# Patient Record
Sex: Male | Born: 1980 | Race: Black or African American | Hispanic: No | Marital: Married | Smoking: Never smoker
Health system: Southern US, Community
[De-identification: ages and names within clinical notes are randomized; demographics above are authoritative.]

---

## 2014-10-22 ENCOUNTER — Ambulatory Visit (HOSPITAL_BASED_OUTPATIENT_CLINIC_OR_DEPARTMENT_OTHER)
Admission: RE | Admit: 2014-10-22 | Discharge: 2014-10-22 | Disposition: A | Discharge status: Home / Self Care | Payer: 59 | Source: Ambulatory Visit | Attending: Family Medicine | Admitting: Family Medicine

## 2014-10-22 ENCOUNTER — Other Ambulatory Visit (HOSPITAL_BASED_OUTPATIENT_CLINIC_OR_DEPARTMENT_OTHER): Payer: Self-pay | Admitting: Family Medicine

## 2014-10-22 DIAGNOSIS — T1490XA Injury, unspecified, initial encounter: Secondary | ICD-10-CM

## 2014-10-22 DIAGNOSIS — M545 Low back pain: Secondary | ICD-10-CM | POA: Insufficient documentation
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
## 2017-06-24 DIAGNOSIS — J069 Acute upper respiratory infection, unspecified: Secondary | ICD-10-CM | POA: Diagnosis not present

## 2017-06-24 DIAGNOSIS — J302 Other seasonal allergic rhinitis: Secondary | ICD-10-CM | POA: Diagnosis not present

## 2017-11-13 ENCOUNTER — Encounter (HOSPITAL_COMMUNITY): Payer: Self-pay

## 2017-11-13 ENCOUNTER — Emergency Department (HOSPITAL_COMMUNITY): Payer: BLUE CROSS/BLUE SHIELD

## 2017-11-13 ENCOUNTER — Emergency Department (HOSPITAL_COMMUNITY)
Admission: EM | Admit: 2017-11-13 | Discharge: 2017-11-14 | Disposition: A | Discharge status: Home / Self Care | Payer: BLUE CROSS/BLUE SHIELD | Attending: Emergency Medicine | Admitting: Emergency Medicine

## 2017-11-13 ENCOUNTER — Other Ambulatory Visit: Payer: Self-pay

## 2017-11-13 DIAGNOSIS — S99911A Unspecified injury of right ankle, initial encounter: Secondary | ICD-10-CM | POA: Diagnosis present

## 2017-11-13 DIAGNOSIS — Y929 Unspecified place or not applicable: Secondary | ICD-10-CM | POA: Diagnosis not present

## 2017-11-13 DIAGNOSIS — Y999 Unspecified external cause status: Secondary | ICD-10-CM | POA: Diagnosis not present

## 2017-11-13 DIAGNOSIS — Y9367 Activity, basketball: Secondary | ICD-10-CM | POA: Insufficient documentation

## 2017-11-13 DIAGNOSIS — X500XXA Overexertion from strenuous movement or load, initial encounter: Secondary | ICD-10-CM | POA: Insufficient documentation

## 2017-11-13 DIAGNOSIS — S93401A Sprain of unspecified ligament of right ankle, initial encounter: Secondary | ICD-10-CM | POA: Diagnosis not present

## 2017-11-13 MED ORDER — IBUPROFEN 600 MG PO TABS: 600.0000 mg | ORAL_TABLET | Freq: Four times a day (QID) | ORAL | 0 refills | Status: DC | PRN

## 2017-11-13 NOTE — Discharge Instructions (Signed)
Wear a walking boot or ankle brace for stability.  We recommend crutches over the next few days to prevent from putting weight on your right ankle.  Take ibuprofen as prescribed for inflammation.  Ice your ankle 3-4 times per day for 15-20 minutes each time to limit swelling.  Follow-up with an orthopedist to ensure proper healing of your injury.  You may return to the emergency department, as needed, for new or concerning symptoms.

## 2017-11-13 NOTE — ED Provider Notes (Signed)
[facility]-EMERGENCY DEPT Provider Note   CSN: [id number] Arrival date & time: 11/13/17  2116     History   Chief Complaint Chief Complaint  Patient presents with  . Ankle Pain    R    HPI Zachary Moronan Villarreal is a 37 y.o. male.  37 year old male presents to the emergency department for evaluation of right ankle pain which began tonight while playing basketball.  He reports hearing a "snap" followed by onset of pain which has since been constant.  Pain is aggravated with ambulation, the patient has been weightbearing since the incident.  He has not taken any medications prior to arrival for symptoms.  No associated numbness or paresthesias.  He does have history of a high-grade right ankle sprain.  He has not been followed by orthopedics in many years.      History reviewed. No pertinent past medical history.  There are no active problems to display for this patient.   ** The histories are not reviewed yet. Please review them in the "History" navigator section and refresh this SmartLink.     Home Medications    Prior to Admission medications   Medication Sig Start Date End Date Taking? Authorizing Provider  ibuprofen (ADVIL,MOTRIN) 600 MG tablet Take 1 tablet (600 mg total) by mouth every 6 (six) hours as needed. 11/13/17   Antony MaduraHumes, Reyan Helle, PA-C    Family History History reviewed. No pertinent family history.  Social History Social History   Tobacco Use  . Smoking status: Not on file  Substance Use Topics  . Alcohol use: Not on file  . Drug use: Not on file     Allergies   Patient has no known allergies.   Review of Systems Review of Systems Ten systems reviewed and are negative for acute change, except as noted in the HPI.    Physical Exam Updated Vital Signs BP 133/85 (BP Location: Left Arm)   Pulse 66   Temp 98.5 F (36.9 C) (Oral)   Resp 19   SpO2 95%   Physical Exam  Constitutional: He is oriented to person, place, and time. He  appears well-developed and well-nourished. No distress.  Nontoxic appearing and in NAD  HENT:  Head: Normocephalic and atraumatic.  Eyes: Conjunctivae and EOM are normal. No scleral icterus.  Neck: Normal range of motion.  Cardiovascular: Normal rate, regular rhythm and intact distal pulses.  DP pulse 2+ in the RLE  Pulmonary/Chest: Effort normal. No stridor. No respiratory distress. He has no wheezes.  Respirations even and unlabored  Musculoskeletal: Normal range of motion. He exhibits tenderness.       Right ankle: He exhibits swelling (mild). He exhibits no deformity, no laceration and normal pulse. Tenderness. Lateral malleolus and medial malleolus tenderness found. Achilles tendon normal.       Feet:  Tenderness to palpation across the anterior ankle with normal Thompson test.  Focal tenderness noted to both the lateral and medial malleolus of the right ankle without bony deformity or crepitus.  Mild swelling to the ankle without effusion.  Patient able to wiggle all toes.  Neurological: He is alert and oriented to person, place, and time. He exhibits normal muscle tone. Coordination normal.  Sensation to light touch intact in BLE  Skin: Skin is warm and dry. No rash noted. He is not diaphoretic. No erythema. No pallor.  Psychiatric: He has a normal mood and affect. His behavior is normal.  Nursing note and vitals reviewed.    ED Treatments /  Results  Labs (all labs ordered are listed, but only abnormal results are displayed) Labs Reviewed - No data to display  EKG  EKG Interpretation None       Radiology Dg Ankle Complete Right  Result Date: 11/13/2017 CLINICAL DATA:  37 y/o  M; it rolling injury with pain. EXAM: RIGHT ANKLE - COMPLETE 3+ VIEW COMPARISON:  None. FINDINGS: Bony body adjacent to the medial malleolus may represent a mildly displaced fracture, correlate for focal tenderness. Talar dome is intact. Ankle mortise is symmetric on these nonstress views.  Osteophytes at the talar neck compatible with anterior ankle impingement in the appropriate clinical setting. Calcific tendinitis of the Achilles tendon. IMPRESSION: 1. Bony body adjacent to medial malleolus may represent mildly displaced fracture. Correlate for focal tenderness. 2. No ankle joint dislocation. 3. Osteophytosis of talar neck may represent anterior ankle impingement in the appropriate clinical setting. 4. Calcific tendinitis of Achilles tendon. Electronically Signed   By: Mitzi Hansen M.D.   On: 11/13/2017 22:56    Procedures Procedures (including critical care time)  Medications Ordered in ED Medications - No data to display   Initial Impression / Assessment and Plan / ED Course  I have reviewed the triage vital signs and the nursing notes.  Pertinent labs & imaging results that were available during my care of the patient were reviewed by me and considered in my medical decision making (see chart for details).     Patient presents to the emergency department for evaluation of R ankle pain, onset while playing basketball tonight. Patient neurovascularly intact on exam. Imaging notable for bony body adjacent to the medial malleolus.  This is less likely thought to represent acute fracture.  Patient does report prior high-grade ankle sprain to the right ankle.  He has been placed in a CAM walker and given an ASO ankle brace to use PRN.  Crutches provided for WBAT. Plan for supportive management including RICE and NSAIDs; Orthopedic f/u recommended. Return precautions discussed and provided. Patient discharged in stable condition with no unaddressed concerns.   Final Clinical Impressions(s) / ED Diagnoses   Final diagnoses:  Sprain of right ankle, unspecified ligament, initial encounter    ED Discharge Orders        Ordered    ibuprofen (ADVIL,MOTRIN) 600 MG tablet  Every 6 hours PRN     11/13/17 2333       Antony Madura, PA-C 11/13/17 2347    Glynn Octave, MD 11/14/17 [phone]

## 2017-11-13 NOTE — ED Triage Notes (Addendum)
Pt rolled his ankle inward while playing basketball tonight. He reports that he heard a "snap." No obvious deformity noted. Pt is ambulatory in triage. A&Ox4.

## 2017-11-21 DIAGNOSIS — M25571 Pain in right ankle and joints of right foot: Secondary | ICD-10-CM | POA: Diagnosis not present

## 2018-07-03 DIAGNOSIS — J069 Acute upper respiratory infection, unspecified: Secondary | ICD-10-CM | POA: Diagnosis not present

## 2018-07-03 DIAGNOSIS — G44209 Tension-type headache, unspecified, not intractable: Secondary | ICD-10-CM | POA: Diagnosis not present

## 2018-07-03 DIAGNOSIS — Z136 Encounter for screening for cardiovascular disorders: Secondary | ICD-10-CM | POA: Diagnosis not present

## 2018-07-07 DIAGNOSIS — M791 Myalgia, unspecified site: Secondary | ICD-10-CM | POA: Diagnosis not present

## 2018-07-27 DIAGNOSIS — M9902 Segmental and somatic dysfunction of thoracic region: Secondary | ICD-10-CM | POA: Diagnosis not present

## 2018-07-27 DIAGNOSIS — M9903 Segmental and somatic dysfunction of lumbar region: Secondary | ICD-10-CM | POA: Diagnosis not present

## 2018-07-27 DIAGNOSIS — M5417 Radiculopathy, lumbosacral region: Secondary | ICD-10-CM | POA: Diagnosis not present

## 2018-07-27 DIAGNOSIS — M9905 Segmental and somatic dysfunction of pelvic region: Secondary | ICD-10-CM | POA: Diagnosis not present

## 2018-07-28 DIAGNOSIS — M9905 Segmental and somatic dysfunction of pelvic region: Secondary | ICD-10-CM | POA: Diagnosis not present

## 2018-07-28 DIAGNOSIS — M9902 Segmental and somatic dysfunction of thoracic region: Secondary | ICD-10-CM | POA: Diagnosis not present

## 2018-07-28 DIAGNOSIS — M5417 Radiculopathy, lumbosacral region: Secondary | ICD-10-CM | POA: Diagnosis not present

## 2018-07-28 DIAGNOSIS — M9903 Segmental and somatic dysfunction of lumbar region: Secondary | ICD-10-CM | POA: Diagnosis not present

## 2018-08-07 DIAGNOSIS — M9905 Segmental and somatic dysfunction of pelvic region: Secondary | ICD-10-CM | POA: Diagnosis not present

## 2018-08-07 DIAGNOSIS — M9903 Segmental and somatic dysfunction of lumbar region: Secondary | ICD-10-CM | POA: Diagnosis not present

## 2018-08-07 DIAGNOSIS — M9902 Segmental and somatic dysfunction of thoracic region: Secondary | ICD-10-CM | POA: Diagnosis not present

## 2018-08-07 DIAGNOSIS — M5417 Radiculopathy, lumbosacral region: Secondary | ICD-10-CM | POA: Diagnosis not present

## 2019-03-21 DIAGNOSIS — M9902 Segmental and somatic dysfunction of thoracic region: Secondary | ICD-10-CM | POA: Diagnosis not present

## 2019-03-21 DIAGNOSIS — M5417 Radiculopathy, lumbosacral region: Secondary | ICD-10-CM | POA: Diagnosis not present

## 2019-03-21 DIAGNOSIS — M9905 Segmental and somatic dysfunction of pelvic region: Secondary | ICD-10-CM | POA: Diagnosis not present

## 2019-03-21 DIAGNOSIS — M9903 Segmental and somatic dysfunction of lumbar region: Secondary | ICD-10-CM | POA: Diagnosis not present

## 2019-03-23 DIAGNOSIS — M9902 Segmental and somatic dysfunction of thoracic region: Secondary | ICD-10-CM | POA: Diagnosis not present

## 2019-03-23 DIAGNOSIS — M5386 Other specified dorsopathies, lumbar region: Secondary | ICD-10-CM | POA: Diagnosis not present

## 2019-03-23 DIAGNOSIS — M9905 Segmental and somatic dysfunction of pelvic region: Secondary | ICD-10-CM | POA: Diagnosis not present

## 2019-03-23 DIAGNOSIS — M9903 Segmental and somatic dysfunction of lumbar region: Secondary | ICD-10-CM | POA: Diagnosis not present

## 2019-03-27 DIAGNOSIS — M9905 Segmental and somatic dysfunction of pelvic region: Secondary | ICD-10-CM | POA: Diagnosis not present

## 2019-03-27 DIAGNOSIS — M5386 Other specified dorsopathies, lumbar region: Secondary | ICD-10-CM | POA: Diagnosis not present

## 2019-03-27 DIAGNOSIS — M9902 Segmental and somatic dysfunction of thoracic region: Secondary | ICD-10-CM | POA: Diagnosis not present

## 2019-03-27 DIAGNOSIS — M9903 Segmental and somatic dysfunction of lumbar region: Secondary | ICD-10-CM | POA: Diagnosis not present

## 2019-04-04 DIAGNOSIS — M9905 Segmental and somatic dysfunction of pelvic region: Secondary | ICD-10-CM | POA: Diagnosis not present

## 2019-04-04 DIAGNOSIS — M5386 Other specified dorsopathies, lumbar region: Secondary | ICD-10-CM | POA: Diagnosis not present

## 2019-04-04 DIAGNOSIS — M9903 Segmental and somatic dysfunction of lumbar region: Secondary | ICD-10-CM | POA: Diagnosis not present

## 2019-04-04 DIAGNOSIS — M9902 Segmental and somatic dysfunction of thoracic region: Secondary | ICD-10-CM | POA: Diagnosis not present

## 2019-04-11 DIAGNOSIS — M9903 Segmental and somatic dysfunction of lumbar region: Secondary | ICD-10-CM | POA: Diagnosis not present

## 2019-04-11 DIAGNOSIS — M5386 Other specified dorsopathies, lumbar region: Secondary | ICD-10-CM | POA: Diagnosis not present

## 2019-04-11 DIAGNOSIS — M9902 Segmental and somatic dysfunction of thoracic region: Secondary | ICD-10-CM | POA: Diagnosis not present

## 2019-04-11 DIAGNOSIS — M9905 Segmental and somatic dysfunction of pelvic region: Secondary | ICD-10-CM | POA: Diagnosis not present

## 2019-04-18 DIAGNOSIS — M5386 Other specified dorsopathies, lumbar region: Secondary | ICD-10-CM | POA: Diagnosis not present

## 2019-04-18 DIAGNOSIS — M9905 Segmental and somatic dysfunction of pelvic region: Secondary | ICD-10-CM | POA: Diagnosis not present

## 2019-04-18 DIAGNOSIS — M9902 Segmental and somatic dysfunction of thoracic region: Secondary | ICD-10-CM | POA: Diagnosis not present

## 2019-04-18 DIAGNOSIS — M9903 Segmental and somatic dysfunction of lumbar region: Secondary | ICD-10-CM | POA: Diagnosis not present

## 2019-05-04 DIAGNOSIS — M9903 Segmental and somatic dysfunction of lumbar region: Secondary | ICD-10-CM | POA: Diagnosis not present

## 2019-05-04 DIAGNOSIS — M9905 Segmental and somatic dysfunction of pelvic region: Secondary | ICD-10-CM | POA: Diagnosis not present

## 2019-05-04 DIAGNOSIS — M9902 Segmental and somatic dysfunction of thoracic region: Secondary | ICD-10-CM | POA: Diagnosis not present

## 2019-05-04 DIAGNOSIS — M5386 Other specified dorsopathies, lumbar region: Secondary | ICD-10-CM | POA: Diagnosis not present

## 2019-05-22 DIAGNOSIS — M9902 Segmental and somatic dysfunction of thoracic region: Secondary | ICD-10-CM | POA: Diagnosis not present

## 2019-05-22 DIAGNOSIS — M9905 Segmental and somatic dysfunction of pelvic region: Secondary | ICD-10-CM | POA: Diagnosis not present

## 2019-05-22 DIAGNOSIS — M9903 Segmental and somatic dysfunction of lumbar region: Secondary | ICD-10-CM | POA: Diagnosis not present

## 2019-05-22 DIAGNOSIS — M5386 Other specified dorsopathies, lumbar region: Secondary | ICD-10-CM | POA: Diagnosis not present

## 2019-05-29 DIAGNOSIS — M9905 Segmental and somatic dysfunction of pelvic region: Secondary | ICD-10-CM | POA: Diagnosis not present

## 2019-05-29 DIAGNOSIS — M9902 Segmental and somatic dysfunction of thoracic region: Secondary | ICD-10-CM | POA: Diagnosis not present

## 2019-05-29 DIAGNOSIS — M9903 Segmental and somatic dysfunction of lumbar region: Secondary | ICD-10-CM | POA: Diagnosis not present

## 2019-05-29 DIAGNOSIS — M5386 Other specified dorsopathies, lumbar region: Secondary | ICD-10-CM | POA: Diagnosis not present

## 2019-06-04 IMAGING — CR DG ANKLE COMPLETE 3+V*R*
3 series · 3 of 3 positions shown · non-contrast
Comparison: None.

CLINICAL DATA: 36 y/o  M; it rolling injury with pain.

EXAM:
RIGHT ANKLE - COMPLETE 3+ VIEW

[x ankle ap right]
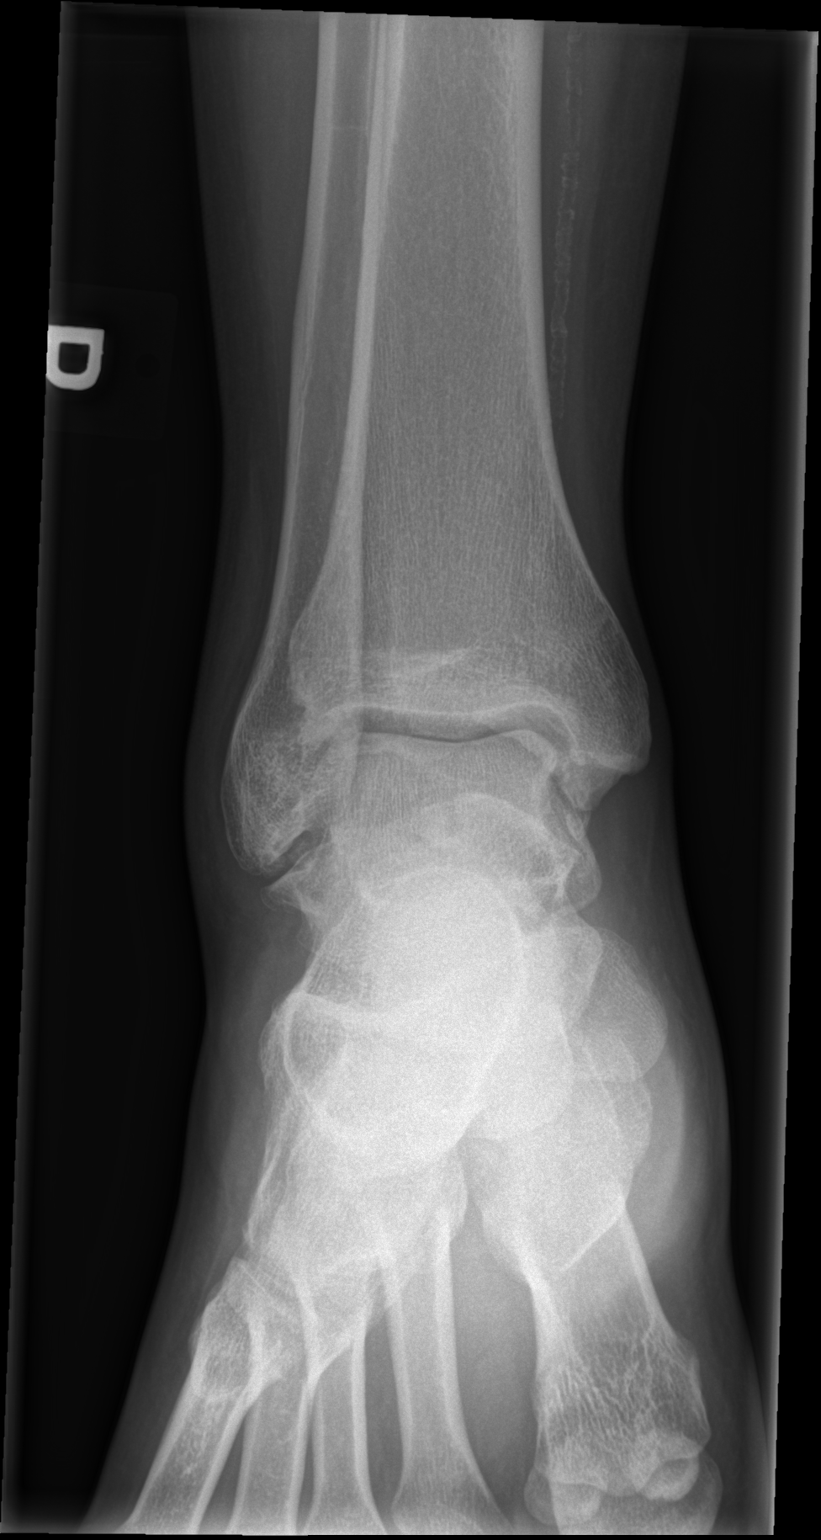

[x ankle obl right]
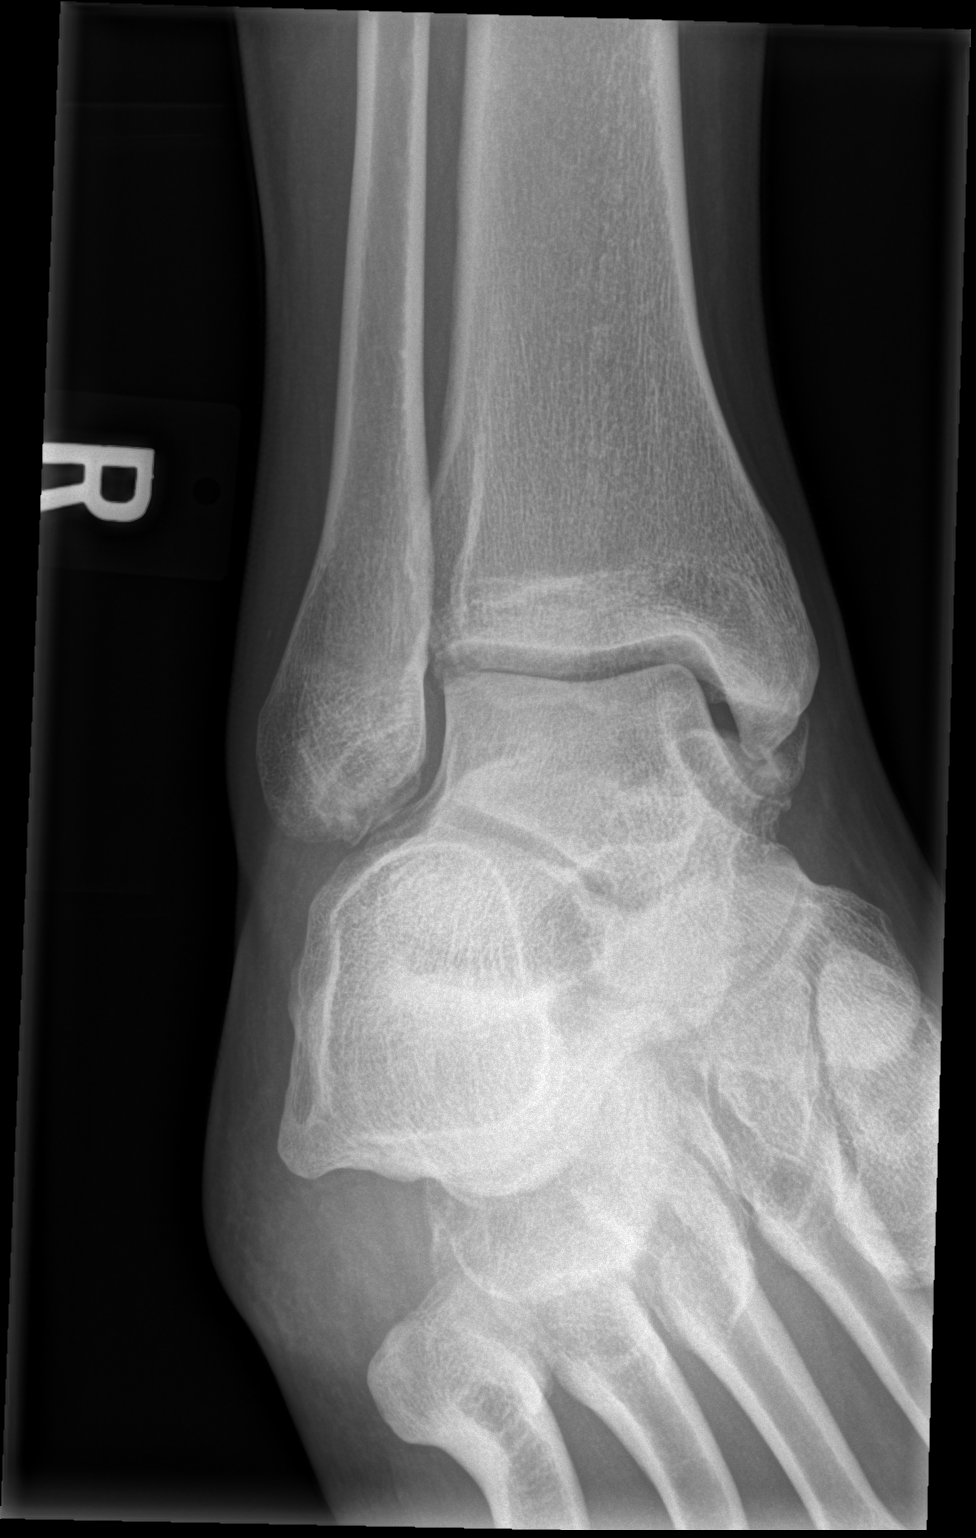

[x ankle lat right]
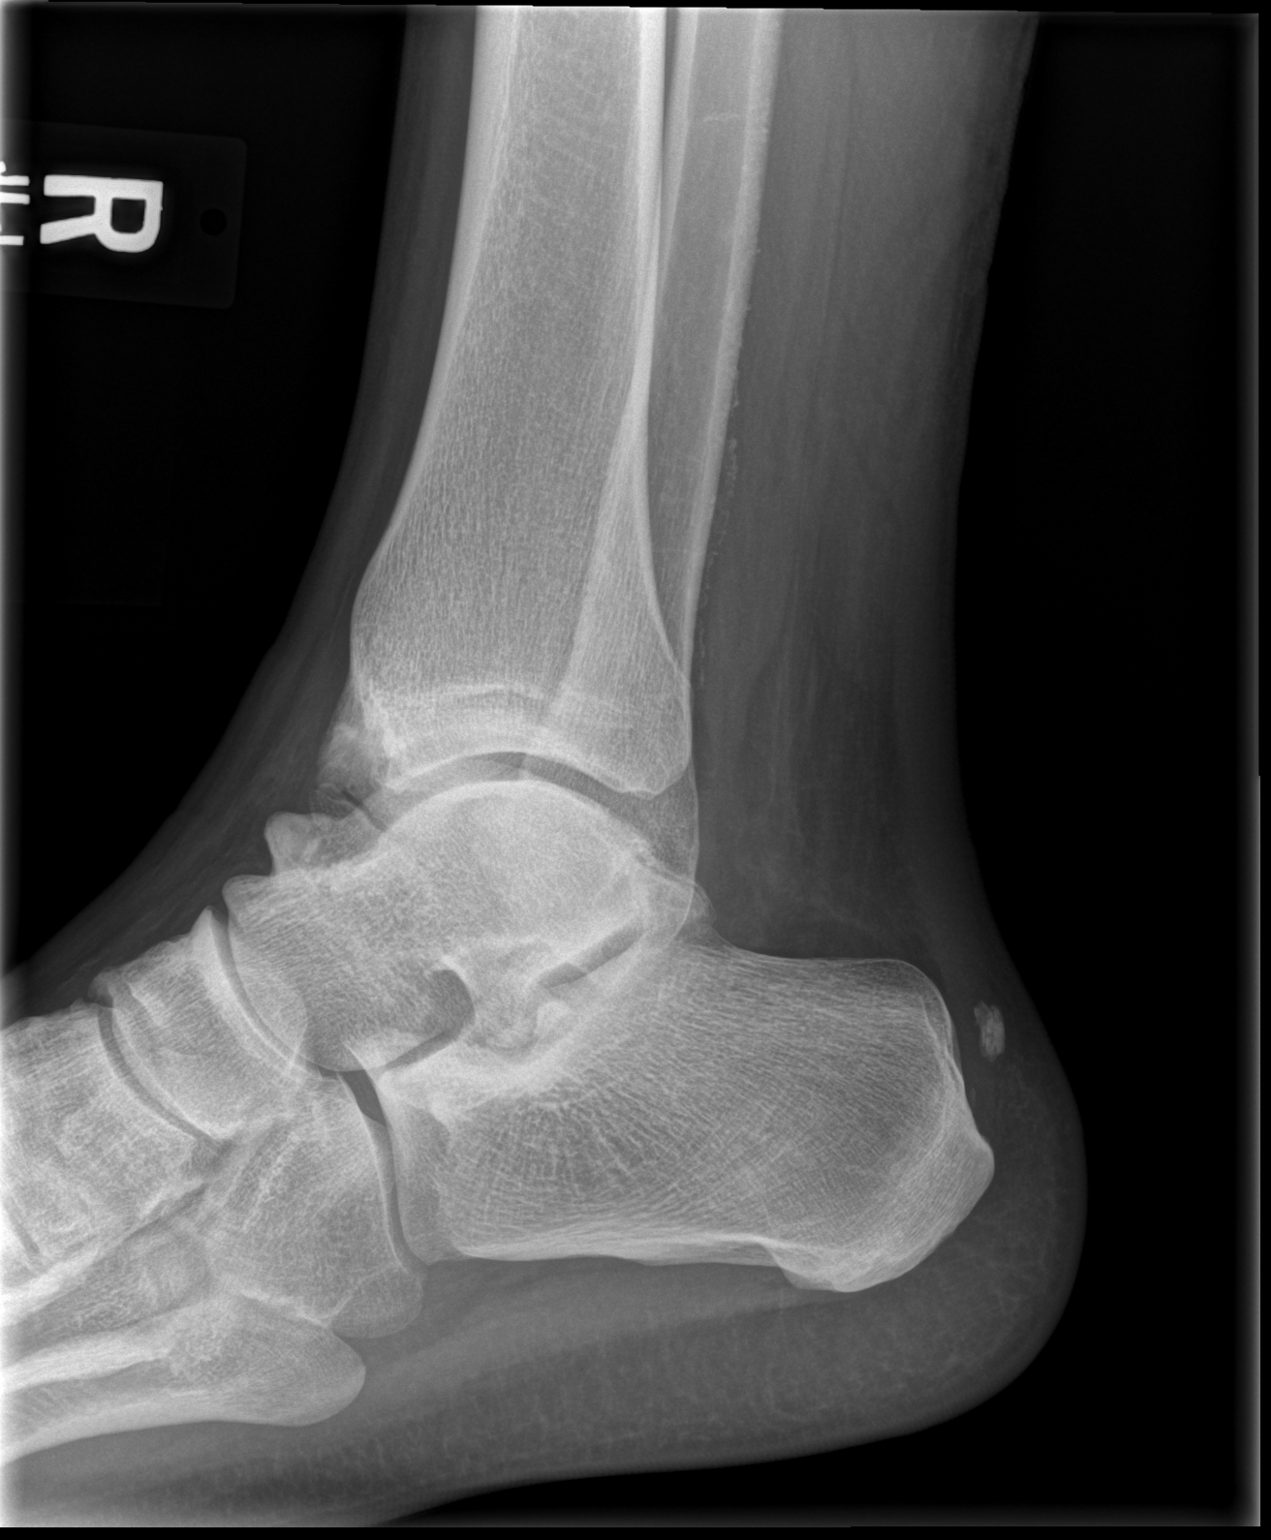

[3 of 3 positions shown; findings below may reference images not displayed]

FINDINGS: Bony body adjacent to the medial malleolus may represent a mildly
displaced fracture, correlate for focal tenderness. Talar dome is
intact. Ankle mortise is symmetric on these nonstress views.
Osteophytes at the talar neck compatible with anterior ankle
impingement in the appropriate clinical setting. Calcific tendinitis
of the Achilles tendon.
IMPRESSION: 1. Bony body adjacent to medial malleolus may represent mildly
displaced fracture. Correlate for focal tenderness.
2. No ankle joint dislocation.
3. Osteophytosis of talar neck may represent anterior ankle
impingement in the appropriate clinical setting.
4. Calcific tendinitis of Achilles tendon.

By: Margo Hsu M.D.

## 2019-06-06 DIAGNOSIS — M9902 Segmental and somatic dysfunction of thoracic region: Secondary | ICD-10-CM | POA: Diagnosis not present

## 2019-06-06 DIAGNOSIS — M9905 Segmental and somatic dysfunction of pelvic region: Secondary | ICD-10-CM | POA: Diagnosis not present

## 2019-06-06 DIAGNOSIS — M9903 Segmental and somatic dysfunction of lumbar region: Secondary | ICD-10-CM | POA: Diagnosis not present

## 2019-06-06 DIAGNOSIS — M5386 Other specified dorsopathies, lumbar region: Secondary | ICD-10-CM | POA: Diagnosis not present

## 2019-06-28 DIAGNOSIS — M9902 Segmental and somatic dysfunction of thoracic region: Secondary | ICD-10-CM | POA: Diagnosis not present

## 2019-06-28 DIAGNOSIS — M9903 Segmental and somatic dysfunction of lumbar region: Secondary | ICD-10-CM | POA: Diagnosis not present

## 2019-06-28 DIAGNOSIS — M5386 Other specified dorsopathies, lumbar region: Secondary | ICD-10-CM | POA: Diagnosis not present

## 2019-06-28 DIAGNOSIS — M9905 Segmental and somatic dysfunction of pelvic region: Secondary | ICD-10-CM | POA: Diagnosis not present

## 2019-07-26 DIAGNOSIS — M5386 Other specified dorsopathies, lumbar region: Secondary | ICD-10-CM | POA: Diagnosis not present

## 2019-07-26 DIAGNOSIS — M9903 Segmental and somatic dysfunction of lumbar region: Secondary | ICD-10-CM | POA: Diagnosis not present

## 2019-07-26 DIAGNOSIS — M9905 Segmental and somatic dysfunction of pelvic region: Secondary | ICD-10-CM | POA: Diagnosis not present

## 2019-07-26 DIAGNOSIS — M9902 Segmental and somatic dysfunction of thoracic region: Secondary | ICD-10-CM | POA: Diagnosis not present

## 2019-09-06 DIAGNOSIS — M9905 Segmental and somatic dysfunction of pelvic region: Secondary | ICD-10-CM | POA: Diagnosis not present

## 2019-09-06 DIAGNOSIS — M5386 Other specified dorsopathies, lumbar region: Secondary | ICD-10-CM | POA: Diagnosis not present

## 2019-09-06 DIAGNOSIS — M9902 Segmental and somatic dysfunction of thoracic region: Secondary | ICD-10-CM | POA: Diagnosis not present

## 2019-09-06 DIAGNOSIS — M9903 Segmental and somatic dysfunction of lumbar region: Secondary | ICD-10-CM | POA: Diagnosis not present

## 2019-10-10 DIAGNOSIS — M9902 Segmental and somatic dysfunction of thoracic region: Secondary | ICD-10-CM | POA: Diagnosis not present

## 2019-10-10 DIAGNOSIS — M5386 Other specified dorsopathies, lumbar region: Secondary | ICD-10-CM | POA: Diagnosis not present

## 2019-10-10 DIAGNOSIS — M9903 Segmental and somatic dysfunction of lumbar region: Secondary | ICD-10-CM | POA: Diagnosis not present

## 2019-10-10 DIAGNOSIS — M9905 Segmental and somatic dysfunction of pelvic region: Secondary | ICD-10-CM | POA: Diagnosis not present

## 2019-10-26 DIAGNOSIS — B369 Superficial mycosis, unspecified: Secondary | ICD-10-CM | POA: Diagnosis not present

## 2019-10-26 DIAGNOSIS — H6063 Unspecified chronic otitis externa, bilateral: Secondary | ICD-10-CM | POA: Diagnosis not present

## 2019-10-26 DIAGNOSIS — H6240 Otitis externa in other diseases classified elsewhere, unspecified ear: Secondary | ICD-10-CM | POA: Diagnosis not present

## 2020-03-05 DIAGNOSIS — Z03818 Encounter for observation for suspected exposure to other biological agents ruled out: Secondary | ICD-10-CM | POA: Diagnosis not present

## 2020-03-05 DIAGNOSIS — Z20822 Contact with and (suspected) exposure to covid-19: Secondary | ICD-10-CM | POA: Diagnosis not present

## 2020-04-24 DIAGNOSIS — M5431 Sciatica, right side: Secondary | ICD-10-CM | POA: Diagnosis not present

## 2020-04-24 DIAGNOSIS — M5416 Radiculopathy, lumbar region: Secondary | ICD-10-CM | POA: Diagnosis not present

## 2020-04-24 DIAGNOSIS — M545 Low back pain: Secondary | ICD-10-CM | POA: Diagnosis not present

## 2020-04-29 DIAGNOSIS — M9902 Segmental and somatic dysfunction of thoracic region: Secondary | ICD-10-CM | POA: Diagnosis not present

## 2020-04-29 DIAGNOSIS — M9905 Segmental and somatic dysfunction of pelvic region: Secondary | ICD-10-CM | POA: Diagnosis not present

## 2020-04-29 DIAGNOSIS — M5127 Other intervertebral disc displacement, lumbosacral region: Secondary | ICD-10-CM | POA: Diagnosis not present

## 2020-04-29 DIAGNOSIS — M9903 Segmental and somatic dysfunction of lumbar region: Secondary | ICD-10-CM | POA: Diagnosis not present

## 2020-04-30 DIAGNOSIS — M5127 Other intervertebral disc displacement, lumbosacral region: Secondary | ICD-10-CM | POA: Diagnosis not present

## 2020-04-30 DIAGNOSIS — M9903 Segmental and somatic dysfunction of lumbar region: Secondary | ICD-10-CM | POA: Diagnosis not present

## 2020-04-30 DIAGNOSIS — M9905 Segmental and somatic dysfunction of pelvic region: Secondary | ICD-10-CM | POA: Diagnosis not present

## 2020-04-30 DIAGNOSIS — M9902 Segmental and somatic dysfunction of thoracic region: Secondary | ICD-10-CM | POA: Diagnosis not present

## 2020-05-02 DIAGNOSIS — M9905 Segmental and somatic dysfunction of pelvic region: Secondary | ICD-10-CM | POA: Diagnosis not present

## 2020-05-02 DIAGNOSIS — M5127 Other intervertebral disc displacement, lumbosacral region: Secondary | ICD-10-CM | POA: Diagnosis not present

## 2020-05-02 DIAGNOSIS — M9903 Segmental and somatic dysfunction of lumbar region: Secondary | ICD-10-CM | POA: Diagnosis not present

## 2020-05-02 DIAGNOSIS — M9902 Segmental and somatic dysfunction of thoracic region: Secondary | ICD-10-CM | POA: Diagnosis not present

## 2020-05-06 DIAGNOSIS — M9905 Segmental and somatic dysfunction of pelvic region: Secondary | ICD-10-CM | POA: Diagnosis not present

## 2020-05-06 DIAGNOSIS — M9902 Segmental and somatic dysfunction of thoracic region: Secondary | ICD-10-CM | POA: Diagnosis not present

## 2020-05-06 DIAGNOSIS — M5127 Other intervertebral disc displacement, lumbosacral region: Secondary | ICD-10-CM | POA: Diagnosis not present

## 2020-05-06 DIAGNOSIS — M9903 Segmental and somatic dysfunction of lumbar region: Secondary | ICD-10-CM | POA: Diagnosis not present

## 2020-05-09 DIAGNOSIS — M5127 Other intervertebral disc displacement, lumbosacral region: Secondary | ICD-10-CM | POA: Diagnosis not present

## 2020-05-09 DIAGNOSIS — M9905 Segmental and somatic dysfunction of pelvic region: Secondary | ICD-10-CM | POA: Diagnosis not present

## 2020-05-09 DIAGNOSIS — M9902 Segmental and somatic dysfunction of thoracic region: Secondary | ICD-10-CM | POA: Diagnosis not present

## 2020-05-09 DIAGNOSIS — M9903 Segmental and somatic dysfunction of lumbar region: Secondary | ICD-10-CM | POA: Diagnosis not present

## 2020-05-15 DIAGNOSIS — M9902 Segmental and somatic dysfunction of thoracic region: Secondary | ICD-10-CM | POA: Diagnosis not present

## 2020-05-15 DIAGNOSIS — M9905 Segmental and somatic dysfunction of pelvic region: Secondary | ICD-10-CM | POA: Diagnosis not present

## 2020-05-15 DIAGNOSIS — M5127 Other intervertebral disc displacement, lumbosacral region: Secondary | ICD-10-CM | POA: Diagnosis not present

## 2020-05-15 DIAGNOSIS — M9903 Segmental and somatic dysfunction of lumbar region: Secondary | ICD-10-CM | POA: Diagnosis not present

## 2020-05-22 DIAGNOSIS — M5127 Other intervertebral disc displacement, lumbosacral region: Secondary | ICD-10-CM | POA: Diagnosis not present

## 2020-05-22 DIAGNOSIS — M9902 Segmental and somatic dysfunction of thoracic region: Secondary | ICD-10-CM | POA: Diagnosis not present

## 2020-05-22 DIAGNOSIS — M9905 Segmental and somatic dysfunction of pelvic region: Secondary | ICD-10-CM | POA: Diagnosis not present

## 2020-05-22 DIAGNOSIS — M9903 Segmental and somatic dysfunction of lumbar region: Secondary | ICD-10-CM | POA: Diagnosis not present

## 2020-05-23 ENCOUNTER — Other Ambulatory Visit: Payer: Self-pay

## 2020-05-23 DIAGNOSIS — Z20822 Contact with and (suspected) exposure to covid-19: Secondary | ICD-10-CM

## 2020-05-24 LAB — SARS-COV-2, NAA 2 DAY TAT

## 2020-05-24 LAB — NOVEL CORONAVIRUS, NAA: SARS-CoV-2, NAA: NOT DETECTED

## 2020-07-07 ENCOUNTER — Other Ambulatory Visit: Payer: BLUE CROSS/BLUE SHIELD

## 2020-07-07 ENCOUNTER — Other Ambulatory Visit: Payer: Self-pay

## 2020-07-07 DIAGNOSIS — Z20822 Contact with and (suspected) exposure to covid-19: Secondary | ICD-10-CM

## 2020-07-08 LAB — NOVEL CORONAVIRUS, NAA: SARS-CoV-2, NAA: NOT DETECTED

## 2020-07-08 LAB — SARS-COV-2, NAA 2 DAY TAT

## 2021-03-21 ENCOUNTER — Emergency Department (HOSPITAL_COMMUNITY)
Admission: EM | Admit: 2021-03-21 | Discharge: 2021-03-22 | Disposition: A | Discharge status: Home / Self Care | Payer: BC Managed Care – PPO | Attending: Emergency Medicine | Admitting: Emergency Medicine

## 2021-03-21 ENCOUNTER — Encounter (HOSPITAL_COMMUNITY): Payer: Self-pay | Admitting: *Deleted

## 2021-03-21 ENCOUNTER — Emergency Department (HOSPITAL_COMMUNITY)
Admission: EM | Admit: 2021-03-21 | Discharge: 2021-03-21 | Disposition: A | Discharge status: Home / Self Care | Payer: BC Managed Care – PPO | Attending: Emergency Medicine | Admitting: Emergency Medicine

## 2021-03-21 ENCOUNTER — Other Ambulatory Visit: Payer: Self-pay

## 2021-03-21 DIAGNOSIS — Z5321 Procedure and treatment not carried out due to patient leaving prior to being seen by health care provider: Secondary | ICD-10-CM | POA: Insufficient documentation

## 2021-03-21 DIAGNOSIS — R1031 Right lower quadrant pain: Secondary | ICD-10-CM | POA: Insufficient documentation

## 2021-03-21 DIAGNOSIS — R1030 Lower abdominal pain, unspecified: Secondary | ICD-10-CM | POA: Insufficient documentation

## 2021-03-21 DIAGNOSIS — R197 Diarrhea, unspecified: Secondary | ICD-10-CM | POA: Insufficient documentation

## 2021-03-21 DIAGNOSIS — N201 Calculus of ureter: Secondary | ICD-10-CM | POA: Insufficient documentation

## 2021-03-21 DIAGNOSIS — R112 Nausea with vomiting, unspecified: Secondary | ICD-10-CM | POA: Insufficient documentation

## 2021-03-21 LAB — CBC WITH DIFFERENTIAL/PLATELET
Abs Immature Granulocytes: 0.01 10*3/uL (ref 0.00–0.07)
Basophils Absolute: 0 10*3/uL (ref 0.0–0.1)
Basophils Relative: 1 %
Eosinophils Absolute: 0 10*3/uL (ref 0.0–0.5)
Eosinophils Relative: 0 %
HCT: 43.7 % (ref 39.0–52.0)
Hemoglobin: 14.9 g/dL (ref 13.0–17.0)
Immature Granulocytes: 0 %
Lymphocytes Relative: 21 %
Lymphs Abs: 1.2 10*3/uL (ref 0.7–4.0)
MCH: 30.6 pg (ref 26.0–34.0)
MCHC: 34.1 g/dL (ref 30.0–36.0)
MCV: 89.7 fL (ref 80.0–100.0)
Monocytes Absolute: 0.4 10*3/uL (ref 0.1–1.0)
Monocytes Relative: 7 %
Neutro Abs: 4 10*3/uL (ref 1.7–7.7)
Neutrophils Relative %: 71 %
Platelets: 277 10*3/uL (ref 150–400)
RBC: 4.87 MIL/uL (ref 4.22–5.81)
RDW: 12.2 % (ref 11.5–15.5)
WBC: 5.6 10*3/uL (ref 4.0–10.5)
nRBC: 0 % (ref 0.0–0.2)

## 2021-03-21 LAB — COMPREHENSIVE METABOLIC PANEL
ALT: 24 U/L (ref 0–44)
AST: 24 U/L (ref 15–41)
Albumin: 4.8 g/dL (ref 3.5–5.0)
Alkaline Phosphatase: 60 U/L (ref 38–126)
Anion gap: 8 (ref 5–15)
BUN: 10 mg/dL (ref 6–20)
CO2: 28 mmol/L (ref 22–32)
Calcium: 9.4 mg/dL (ref 8.9–10.3)
Chloride: 102 mmol/L (ref 98–111)
Creatinine, Ser: 1.3 mg/dL — ABNORMAL HIGH (ref 0.61–1.24)
GFR, Estimated: >60 mL/min (ref >60)
Glucose, Bld: 102 mg/dL — ABNORMAL HIGH (ref 70–99)
Potassium: 3.9 mmol/L (ref 3.5–5.1)
Sodium: 138 mmol/L (ref 135–145)
Total Bilirubin: 0.6 mg/dL (ref 0.3–1.2)
Total Protein: 7.8 g/dL (ref 6.5–8.1)

## 2021-03-21 LAB — URINALYSIS, ROUTINE W REFLEX MICROSCOPIC
Bilirubin Urine: NEGATIVE
Glucose, UA: NEGATIVE mg/dL
Ketones, ur: NEGATIVE mg/dL
Leukocytes,Ua: NEGATIVE
Nitrite: NEGATIVE
Protein, ur: NEGATIVE mg/dL
Specific Gravity, Urine: 1.019 (ref 1.005–1.030)
pH: 6 (ref 5.0–8.0)

## 2021-03-21 LAB — LIPASE, BLOOD: Lipase: 30 U/L (ref 11–51)

## 2021-03-21 NOTE — ED Triage Notes (Signed)
The pt has had abd pain since 1630 today  rt lower lateral and pain no  n v or diarrhea

## 2021-03-21 NOTE — ED Triage Notes (Signed)
Pt complains of lower abdominal pain for 1 hour. He started feeling pain after eating out with some friends. No emesis or diarrhea.

## 2021-03-21 NOTE — ED Provider Notes (Signed)
Emergency Medicine Provider Triage Evaluation Note  Zachary Villarreal , a 40 y.o. male  was evaluated in triage.  Pt complains of right side abdominal pain onset 1 hour PTA, after eating, on the car ride home, described as sharp, constant, nothing makes pain better or worse. NO history of kidney stones, no prior abdominal surgeries.   Review of Systems  Positive: Abdominal pain, constipation (last bowel movement 1 hour ago) Negative: Changes in bladder habits, nausea, vomiting   Physical Exam  BP (!) 156/116 (BP Location: Right Arm)   Pulse 63   Temp 98.5 F (36.9 C) (Oral)   Resp 18   SpO2 100%  Gen:   Awake, no distress   Resp:  Normal effort  MSK:   Moves extremities without difficulty  Other:  No TTP abdomen  Medical Decision Making  Medically screening exam initiated at 5:43 PM.  Appropriate orders placed.  Lindy Pennisi was informed that the remainder of the evaluation will be completed by another provider, this initial triage assessment does not replace that evaluation, and the importance of remaining in the ED until their evaluation is complete.     Jeannie Fend, PA-C 03/21/21 1745    Cheryll Cockayne, MD 03/22/21 [phone]

## 2021-03-22 ENCOUNTER — Emergency Department (HOSPITAL_BASED_OUTPATIENT_CLINIC_OR_DEPARTMENT_OTHER)
Admission: EM | Admit: 2021-03-22 | Discharge: 2021-03-22 | Disposition: A | Discharge status: Home / Self Care | Payer: BC Managed Care – PPO | Source: Home / Self Care | Attending: Emergency Medicine | Admitting: Emergency Medicine

## 2021-03-22 ENCOUNTER — Encounter (HOSPITAL_BASED_OUTPATIENT_CLINIC_OR_DEPARTMENT_OTHER): Payer: Self-pay | Admitting: Emergency Medicine

## 2021-03-22 ENCOUNTER — Emergency Department (HOSPITAL_BASED_OUTPATIENT_CLINIC_OR_DEPARTMENT_OTHER): Payer: BC Managed Care – PPO

## 2021-03-22 DIAGNOSIS — N201 Calculus of ureter: Secondary | ICD-10-CM

## 2021-03-22 DIAGNOSIS — R197 Diarrhea, unspecified: Secondary | ICD-10-CM | POA: Diagnosis not present

## 2021-03-22 DIAGNOSIS — R112 Nausea with vomiting, unspecified: Secondary | ICD-10-CM | POA: Diagnosis not present

## 2021-03-22 DIAGNOSIS — R1031 Right lower quadrant pain: Secondary | ICD-10-CM | POA: Diagnosis present

## 2021-03-22 LAB — URINALYSIS, ROUTINE W REFLEX MICROSCOPIC
Bilirubin Urine: NEGATIVE
Glucose, UA: NEGATIVE mg/dL
Hgb urine dipstick: NEGATIVE
Ketones, ur: NEGATIVE mg/dL
Leukocytes,Ua: NEGATIVE
Nitrite: NEGATIVE
Specific Gravity, Urine: >1.046 — ABNORMAL HIGH (ref 1.005–1.030)
pH: 6 (ref 5.0–8.0)

## 2021-03-22 LAB — CBC WITH DIFFERENTIAL/PLATELET
Abs Immature Granulocytes: 0.01 10*3/uL (ref 0.00–0.07)
Basophils Absolute: 0 10*3/uL (ref 0.0–0.1)
Basophils Relative: 1 %
Eosinophils Absolute: 0 10*3/uL (ref 0.0–0.5)
Eosinophils Relative: 0 %
HCT: 44.1 % (ref 39.0–52.0)
Hemoglobin: 15.2 g/dL (ref 13.0–17.0)
Immature Granulocytes: 0 %
Lymphocytes Relative: 25 %
Lymphs Abs: 1.2 10*3/uL (ref 0.7–4.0)
MCH: 30.6 pg (ref 26.0–34.0)
MCHC: 34.5 g/dL (ref 30.0–36.0)
MCV: 88.7 fL (ref 80.0–100.0)
Monocytes Absolute: 0.6 10*3/uL (ref 0.1–1.0)
Monocytes Relative: 12 %
Neutro Abs: 3 10*3/uL (ref 1.7–7.7)
Neutrophils Relative %: 62 %
Platelets: 250 10*3/uL (ref 150–400)
RBC: 4.97 MIL/uL (ref 4.22–5.81)
RDW: 12.2 % (ref 11.5–15.5)
WBC: 4.8 10*3/uL (ref 4.0–10.5)
nRBC: 0 % (ref 0.0–0.2)

## 2021-03-22 LAB — COMPREHENSIVE METABOLIC PANEL
ALT: 21 U/L (ref 0–44)
AST: 19 U/L (ref 15–41)
Albumin: 4.5 g/dL (ref 3.5–5.0)
Alkaline Phosphatase: 56 U/L (ref 38–126)
Anion gap: 7 (ref 5–15)
BUN: 9 mg/dL (ref 6–20)
CO2: 29 mmol/L (ref 22–32)
Calcium: 9.7 mg/dL (ref 8.9–10.3)
Chloride: 103 mmol/L (ref 98–111)
Creatinine, Ser: 1.19 mg/dL (ref 0.61–1.24)
GFR, Estimated: >60 mL/min (ref >60)
Glucose, Bld: 94 mg/dL (ref 70–99)
Potassium: 3.6 mmol/L (ref 3.5–5.1)
Sodium: 139 mmol/L (ref 135–145)
Total Bilirubin: 0.8 mg/dL (ref 0.3–1.2)
Total Protein: 7 g/dL (ref 6.5–8.1)

## 2021-03-22 MED ORDER — OXYCODONE-ACETAMINOPHEN 5-325 MG PO TABS: 1.0000 | ORAL_TABLET | Freq: Four times a day (QID) | ORAL | 0 refills | Status: AC | PRN

## 2021-03-22 MED ORDER — IOHEXOL 300 MG/ML  SOLN
100.0000 mL | Freq: Once | INTRAMUSCULAR | Status: AC | PRN
  Administered 2021-03-22: 100 mL via INTRAVENOUS

## 2021-03-22 MED ORDER — KETOROLAC TROMETHAMINE 30 MG/ML IJ SOLN
30.0000 mg | Freq: Once | INTRAMUSCULAR | Status: AC
  Administered 2021-03-22: 30 mg via INTRAVENOUS
  Filled 2021-03-22: qty 1

## 2021-03-22 MED ORDER — SENNOSIDES-DOCUSATE SODIUM 8.6-50 MG PO TABS: 1.0000 | ORAL_TABLET | Freq: Every evening | ORAL | 0 refills | Status: AC | PRN

## 2021-03-22 MED ORDER — TAMSULOSIN HCL 0.4 MG PO CAPS: 0.4000 mg | ORAL_CAPSULE | Freq: Every day | ORAL | 0 refills | Status: AC

## 2021-03-22 MED ORDER — IBUPROFEN 800 MG PO TABS: 800.0000 mg | ORAL_TABLET | Freq: Three times a day (TID) | ORAL | 0 refills | Status: AC | PRN

## 2021-03-22 NOTE — Discharge Instructions (Signed)

## 2021-03-22 NOTE — ED Provider Notes (Signed)
Emergency Department Provider Note   I have reviewed the triage vital signs and the nursing notes.   HISTORY  Chief Complaint Abdominal Pain   HPI Zachary Villarreal is a 40 y.o. male with past medical history reviewed below presents to the emergency department with right lower quadrant abdominal pain starting yesterday.  Pain began in the right lower quadrant.  No radiation of pain symptoms.  Denies any dysuria, hesitancy, urgency.  No fevers or chills.  Has not had much of an appetite over the past 24 hours.  Denies any pain into his chest, back, flank.  No similar pain in the past.  No prior history of abdominal surgery.  He went to [facility] and had a medical screening exam but was ultimately not able to be seen.  He left and got some rest and then went to urgent care this morning but was referred back to the emergency department.  He states that compared to yesterday his pain is improved slightly.   History reviewed. No pertinent past medical history.  There are no problems to display for this patient.   History reviewed. No pertinent surgical history.  Allergies Patient has no known allergies.  No family history on file.  Social History Social History   Tobacco Use   Smoking status: Never   Smokeless tobacco: Never  Vaping Use   Vaping Use: Never used  Substance Use Topics   Alcohol use: Yes    Comment: occasionally   Drug use: Never    Review of Systems  Constitutional: No fever/chills Eyes: No visual changes. ENT: No sore throat. Cardiovascular: Denies chest pain. Respiratory: Denies shortness of breath. Gastrointestinal: Positive RLQ abdominal pain.  Mild nausea, no vomiting.  No diarrhea.  No constipation. Genitourinary: Negative for dysuria. No testicle pain.  Musculoskeletal: Negative for back pain. Skin: Negative for rash. Neurological: Negative for headaches, focal weakness or numbness.  10-point ROS otherwise  negative.  ____________________________________________   PHYSICAL EXAM:  VITAL SIGNS: ED Triage Vitals  Enc Vitals Group     BP 03/22/21 0921 (!) 162/105     Pulse Rate 03/22/21 0921 (!) 55     Resp 03/22/21 0921 16     Temp 03/22/21 0921 99 F (37.2 C)     Temp Source 03/22/21 0921 Oral     SpO2 03/22/21 0921 100 %     Weight 03/22/21 0921 250 lb (113.4 kg)     Height 03/22/21 0921 6\' 1"  (1.854 m)   Constitutional: Alert and oriented. Well appearing and in no acute distress. Eyes: Conjunctivae are normal.  Head: Atraumatic. Nose: No congestion/rhinnorhea. Mouth/Throat: Mucous membranes are moist.  Neck: No stridor.   Cardiovascular: Normal rate, regular rhythm. Good peripheral circulation. Grossly normal heart sounds.   Respiratory: Normal respiratory effort.  No retractions. Lungs CTAB. Gastrointestinal: Soft with mild tenderness in the right lower quadrant.  No rebound or guarding.  Nontender in the remaining abdominal quadrants. No distention.  Musculoskeletal: No lower extremity tenderness nor edema. No gross deformities of extremities. Neurologic:  Normal speech and language. No gross focal neurologic deficits are appreciated.  Skin:  Skin is warm, dry and intact. No rash noted.  ____________________________________________   LABS (all labs ordered are listed, but only abnormal results are displayed)  Labs Reviewed  URINALYSIS, ROUTINE W REFLEX MICROSCOPIC - Abnormal; Notable for the following components:      Result Value   Color, Urine COLORLESS (*)    Specific Gravity, Urine >1.046 (*)  Protein, ur TRACE (*)    All other components within normal limits  URINE CULTURE  COMPREHENSIVE METABOLIC PANEL  CBC WITH DIFFERENTIAL/PLATELET   ____________________________________________  RADIOLOGY  CT ABDOMEN PELVIS W CONTRAST  Result Date: 03/22/2021 CLINICAL DATA:  Right lower quadrant abdominal pain. Evaluate for appendicitis. EXAM: CT ABDOMEN AND PELVIS WITH  CONTRAST TECHNIQUE: Multidetector CT imaging of the abdomen and pelvis was performed using the standard protocol following bolus administration of intravenous contrast. CONTRAST:  OMNIPAQUE IOHEXOL 300 MG/ML  SOLN COMPARISON:  None. FINDINGS: Lower chest: Normal heart size. Lung bases are clear. No pleural effusion. Hepatobiliary: Liver is normal in size and contour. Gallbladder is unremarkable. No intrahepatic or extrahepatic biliary ductal dilatation. Pancreas: Unremarkable Spleen: Unremarkable Adrenals/Urinary Tract: Normal adrenal glands. Mild delayed enhancement of the right kidney with perinephric fat stranding. There is mild right hydronephrosis to the level of the distal right ureter were there is a 3 mm stone (image 79; series 2). Urinary bladder is unremarkable. Normal appearance of the left kidney. Stomach/Bowel: No abnormal bowel wall thickening or evidence for bowel obstruction. Normal appendix. Normal morphology of the stomach. No free fluid or free intraperitoneal air. Vascular/Lymphatic: Normal caliber abdominal aorta. No retroperitoneal lymphadenopathy. Reproductive: Heterogeneous prostate. Other: None. Musculoskeletal: No aggressive or acute appearing osseous lesions. IMPRESSION: There is an obstructing 3 mm stone within the distal right ureter resulting in mild right hydronephrosis. Normal appendix. Electronically Signed   By: Annia Belt M.D.   On: 03/22/2021 11:33    ____________________________________________   PROCEDURES  Procedure(s) performed:   Procedures  None  ____________________________________________   INITIAL IMPRESSION / ASSESSMENT AND PLAN / ED COURSE  Pertinent labs & imaging results that were available during my care of the patient were reviewed by me and considered in my medical decision making (see chart for details).   Patient presents to the emergency department with right lower quadrant abdominal pain.  There is mild tenderness to left axilla.  No  vomiting to suspect bowel obstruction.  Ureteral stone is a consideration although not radiating to the flank.  No urinary tract infection symptoms.  Doubt pyelonephritis.  No pain into the testicle to suspect torsion.  Plan for repeat labs.  Labs from yesterday at [facility] reviewed with no acute findings or leukocytosis.  We will repeat blood work here and obtain CT scan of the abdomen and pelvis with IV contrast.   CT scan shows a 3 cm obstructing ureteral calculus.  There is some hydronephrosis.  This is the side of patient's discomfort and correlates with symptoms.  UA shows no evidence of infection and pain is well controlled here.  Reviewed the [facility] drug database and prescribed pain medication along with Flomax.  Instructions to follow with urology with information provided at discharge regarding how to do this.  Discussed ED return precautions in detail. Patient has a ride home.  ____________________________________________  FINAL CLINICAL IMPRESSION(S) / ED DIAGNOSES  Final diagnoses:  Right ureteral stone     MEDICATIONS GIVEN DURING THIS VISIT:  Medications  iohexol (OMNIPAQUE) 300 MG/ML solution 100 mL (100 mLs Intravenous Contrast Given 03/22/21 1051)  ketorolac (TORADOL) 30 MG/ML injection 30 mg (30 mg Intravenous Given 03/22/21 1159)     NEW OUTPATIENT MEDICATIONS STARTED DURING THIS VISIT:  Discharge Medication List as of 03/22/2021  1:18 PM     START taking these medications   Details  oxyCODONE-acetaminophen (PERCOCET/ROXICET) 5-325 MG tablet Take 1 tablet by mouth every 6 (six) hours as needed  for severe pain., Starting Sun 03/22/2021, Normal    senna-docusate (SENOKOT-S) 8.6-50 MG tablet Take 1 tablet by mouth at bedtime as needed for mild constipation or moderate constipation., Starting Sun 03/22/2021, Normal    tamsulosin (FLOMAX) 0.4 MG CAPS capsule Take 1 capsule (0.4 mg total) by mouth daily for 14 days., Starting Sun 03/22/2021, Until Sun 04/05/2021,  Normal        Note:  This document was prepared using Dragon voice recognition software and may include unintentional dictation errors.  Alona Bene, MD, [facility]    Neisha Hinger, Arlyss Repress, MD 03/23/21 [phone]

## 2021-03-22 NOTE — ED Notes (Signed)
Attempt piv x1 unsuccessful right AC

## 2021-03-22 NOTE — ED Triage Notes (Signed)
Right lower quadrant pain since yesterday. Denies n/v/d/fever

## 2021-03-22 NOTE — ED Notes (Signed)
Patient transported to CT 

## 2021-03-23 LAB — URINE CULTURE: Culture: NO GROWTH
